# Patient Record
Sex: Male | Born: 1995 | Race: Black or African American | Hispanic: No | Marital: Single | Smoking: Current every day smoker
Health system: Southern US, Community
[De-identification: ages and names within clinical notes are randomized; demographics above are authoritative.]

## PROBLEM LIST (undated history)

## (undated) DIAGNOSIS — R51 Headache: Secondary | ICD-10-CM

## (undated) DIAGNOSIS — R569 Unspecified convulsions: Secondary | ICD-10-CM

## (undated) DIAGNOSIS — R519 Headache, unspecified: Secondary | ICD-10-CM

## (undated) HISTORY — PX: TONSILLECTOMY: SUR1361

---
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
## 2013-01-22 ENCOUNTER — Ambulatory Visit: Payer: Self-pay | Admitting: Pediatrics

## 2013-05-29 DIAGNOSIS — Z0289 Encounter for other administrative examinations: Secondary | ICD-10-CM

## 2013-10-07 ENCOUNTER — Ambulatory Visit: Payer: Self-pay

## 2015-07-25 ENCOUNTER — Emergency Department (HOSPITAL_COMMUNITY): Payer: Medicaid Other

## 2015-07-25 ENCOUNTER — Emergency Department (HOSPITAL_COMMUNITY)
Admission: EM | Admit: 2015-07-25 | Discharge: 2015-07-25 | Disposition: A | Discharge status: Home / Self Care | Payer: Medicaid Other | Attending: Emergency Medicine | Admitting: Emergency Medicine

## 2015-07-25 DIAGNOSIS — F419 Anxiety disorder, unspecified: Secondary | ICD-10-CM | POA: Insufficient documentation

## 2015-07-25 DIAGNOSIS — R0789 Other chest pain: Secondary | ICD-10-CM | POA: Diagnosis not present

## 2015-07-25 DIAGNOSIS — R51 Headache: Secondary | ICD-10-CM | POA: Diagnosis present

## 2015-07-25 LAB — BASIC METABOLIC PANEL
ANION GAP: 13 (ref 5–15)
BUN: 14 mg/dL (ref 6–20)
CALCIUM: 9.3 mg/dL (ref 8.9–10.3)
CO2: 20 mmol/L — AB (ref 22–32)
Chloride: 104 mmol/L (ref 101–111)
Creatinine, Ser: 1.24 mg/dL (ref 0.61–1.24)
GFR calc Af Amer: >60 mL/min (ref >60)
GFR calc non Af Amer: >60 mL/min (ref >60)
GLUCOSE: 78 mg/dL (ref 65–99)
Potassium: 3.3 mmol/L — ABNORMAL LOW (ref 3.5–5.1)
Sodium: 137 mmol/L (ref 135–145)

## 2015-07-25 LAB — CBC WITH DIFFERENTIAL/PLATELET
BASOS ABS: 0 10*3/uL (ref 0.0–0.1)
Basophils Relative: 0 %
Eosinophils Absolute: 0 10*3/uL (ref 0.0–0.7)
Eosinophils Relative: 0 %
HEMATOCRIT: 33.2 % — AB (ref 39.0–52.0)
Hemoglobin: 11.5 g/dL — ABNORMAL LOW (ref 13.0–17.0)
LYMPHS ABS: 1.1 10*3/uL (ref 0.7–4.0)
LYMPHS PCT: 13 %
MCH: 28.4 pg (ref 26.0–34.0)
MCHC: 34.6 g/dL (ref 30.0–36.0)
MCV: 82 fL (ref 78.0–100.0)
MONO ABS: 0.4 10*3/uL (ref 0.1–1.0)
MONOS PCT: 5 %
NEUTROS ABS: 6.8 10*3/uL (ref 1.7–7.7)
Neutrophils Relative %: 82 %
Platelets: 139 10*3/uL — ABNORMAL LOW (ref 150–400)
RBC: 4.05 MIL/uL — ABNORMAL LOW (ref 4.22–5.81)
RDW: 12.4 % (ref 11.5–15.5)
WBC: 8.3 10*3/uL (ref 4.0–10.5)

## 2015-07-25 NOTE — ED Provider Notes (Signed)
CSN: [id number]     Arrival date & time 07/25/15  [id number] History  By signing my name below, I, Tanda RockersMargaux Venter, attest that this documentation has been prepared under the direction and in the presence of Dione Boozeavid Osamu Olguin, MD. Electronically Signed: Tanda RockersMargaux Venter, ED Scribe. 07/25/2015. 3:42 AM.  No chief complaint on file.  The history is provided by the patient. No language interpreter was used.     HPI Comments: Johnathan Pearson is a 19 y.o. male brought in by ambulance, who presents to the Emergency Department complaining of gradual onset, constant, chest tightness that began earlier today around 2 PM (approximately 13.5 hours ago). Pt reports that he ran away from home earlier in the day due to family issues and has been walking on the highway [place] get [place]. He states that his chest began feeling tight while walking on the highway. He also complains of mild shortness of breath that is gradually improving on its own. Denies nausea, vomiting, diaphoresis, or any other associated symptoms. Pt is current every day smoker who smokes 2 ppd. Denies illicit drug use.    No past medical history on file. No past surgical history on file. No family history on file. Social History  Substance Use Topics  . Smoking status: Not on file  . Smokeless tobacco: Not on file  . Alcohol Use: Not on file    Review of Systems  Constitutional: Negative for diaphoresis.  Respiratory: Positive for chest tightness and shortness of breath.   Cardiovascular: Negative for leg swelling.  Gastrointestinal: Negative for nausea and vomiting.  All other systems reviewed and are negative.   Allergies  Review of patient's allergies indicates not on file.  Home Medications   Prior to Admission medications   Not on File   Triage VItals: BP 117/65 mmHg  Pulse 83  Temp(Src) 98.4 F (36.9 C) (Oral)  Resp 20  SpO2 100%   Physical Exam  Constitutional: He is oriented to person, place, and time. He  appears well-developed and well-nourished. No distress.  HENT:  Head: Normocephalic and atraumatic.  Eyes: Conjunctivae and EOM are normal. Pupils are equal, round, and reactive to light.  Neck: Normal range of motion. Neck supple. No JVD present.  Cardiovascular: Normal rate, regular rhythm and normal heart sounds.   No murmur heard. Pulmonary/Chest: Effort normal and breath sounds normal. He has no wheezes. He has no rales. He exhibits no tenderness.  Abdominal: Soft. Bowel sounds are normal. He exhibits no distension and no mass. There is no tenderness.  Musculoskeletal: Normal range of motion. He exhibits no edema.  Lymphadenopathy:    He has no cervical adenopathy.  Neurological: He is alert and oriented to person, place, and time. No cranial nerve deficit. He exhibits normal muscle tone. Coordination normal.  Skin: Skin is warm and dry. No rash noted.  Psychiatric: He has a normal mood and affect. His behavior is normal. Judgment and thought content normal.  Nursing note and vitals reviewed.   ED Course  Procedures (including critical care time)  DIAGNOSTIC STUDIES: Oxygen Saturation is 100% on RA, normal by my interpretation.    COORDINATION OF CARE: 3:42 AM-Discussed treatment plan which includes CXR, BMP, and CBC with pt at bedside and pt agreed to plan.   Labs Review Results for orders placed or performed during the hospital encounter of 07/25/15  CBC with Differential  Result Value Ref Range   WBC 8.3 4.0 - 10.5 K/uL   RBC 4.05 (L)  4.22 - 5.81 MIL/uL   Hemoglobin 11.5 (L) 13.0 - 17.0 g/dL   HCT 16.1 (L) 09.6 - 04.5 %   MCV 82.0 78.0 - 100.0 fL   MCH 28.4 26.0 - 34.0 pg   MCHC 34.6 30.0 - 36.0 g/dL   RDW 40.9 81.1 - 91.4 %   Platelets 139 (L) 150 - 400 K/uL   Neutrophils Relative % 82 %   Neutro Abs 6.8 1.7 - 7.7 K/uL   Lymphocytes Relative 13 %   Lymphs Abs 1.1 0.7 - 4.0 K/uL   Monocytes Relative 5 %   Monocytes Absolute 0.4 0.1 - 1.0 K/uL   Eosinophils  Relative 0 %   Eosinophils Absolute 0.0 0.0 - 0.7 K/uL   Basophils Relative 0 %   Basophils Absolute 0.0 0.0 - 0.1 K/uL  Basic metabolic panel  Result Value Ref Range   Sodium 137 135 - 145 mmol/L   Potassium 3.3 (L) 3.5 - 5.1 mmol/L   Chloride 104 101 - 111 mmol/L   CO2 20 (L) 22 - 32 mmol/L   Glucose, Bld 78 65 - 99 mg/dL   BUN 14 6 - 20 mg/dL   Creatinine, Ser 7.82 0.61 - 1.24 mg/dL   Calcium 9.3 8.9 - 95.6 mg/dL   GFR calc non Af Amer >60 >60 mL/min   GFR calc Af Amer >60 >60 mL/min   Anion gap 13 5 - 15    Imaging Review Dg Chest 2 View  07/25/2015  CLINICAL DATA:  Acute onset of constant chest tightness and mild shortness of breath. Initial encounter. EXAM: CHEST  2 VIEW COMPARISON:  Chest radiograph performed 03/15/2009 FINDINGS: The lungs are well-aerated and clear. There is no evidence of focal opacification, pleural effusion or pneumothorax. The heart is normal in size; the mediastinal contour is within normal limits. No acute osseous abnormalities are seen. IMPRESSION: No acute cardiopulmonary process seen. Electronically Signed   By: Roanna Raider M.D.   On: 07/25/2015 04:05   I have personally reviewed and evaluated these images and lab results as part of my medical decision-making.   EKG Interpretation   Date/Time:  Monday July 25 2015 04:11:35 EST Ventricular Rate:  80 PR Interval:  154 QRS Duration: 78 QT Interval:  373 QTC Calculation: 430 R Axis:   81 Text Interpretation:  Sinus rhythm ST elev, probable normal early repol  pattern When compared with ECG of 06/23/2007, No significant change was  found Confirmed by [facility]  MD, Bayler Gehrig ([id number]) on 07/25/2015 4:14:05 AM      MDM   Final diagnoses:  Anxiety    Chest discomfort with normal physical exam. Patient is somewhat anxious about his home situation and I think that this is playing a major role. Chest x-ray, ECG, screening labs are all unremarkable. On reevaluation, he stated that he was feeling  much better. He is given reassurance and told to follow-up with PCP.   I personally performed the services described in this documentation, which was scribed in my presence. The recorded information has been reviewed and is accurate.        Dione Booze, MD 07/25/15 [phone]

## 2015-07-25 NOTE — ED Notes (Signed)
Patient awaiting sister for DC

## 2015-07-25 NOTE — ED Notes (Signed)
Patient is alert and oriented x4.  [facility] for [facility] headache. Patient states that he was attempting to walk to [place] to stay with his mother.  Patient adds that  There are family issues with him living with his grandparents.  Patient only complaint is a headache

## 2015-07-25 NOTE — Discharge Instructions (Signed)
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
## 2015-07-25 NOTE — ED Notes (Signed)
Bed: WA18 Expected date:  Expected time:  Means of arrival:  Comments: EMS 

## 2016-02-18 ENCOUNTER — Encounter (HOSPITAL_COMMUNITY): Payer: Self-pay | Admitting: Emergency Medicine

## 2016-02-18 ENCOUNTER — Emergency Department (HOSPITAL_COMMUNITY)
Admission: EM | Admit: 2016-02-18 | Discharge: 2016-02-18 | Disposition: A | Discharge status: Home / Self Care | Payer: Medicaid Other | Attending: Emergency Medicine | Admitting: Emergency Medicine

## 2016-02-18 ENCOUNTER — Emergency Department (HOSPITAL_COMMUNITY): Payer: Medicaid Other

## 2016-02-18 DIAGNOSIS — Y939 Activity, unspecified: Secondary | ICD-10-CM | POA: Diagnosis not present

## 2016-02-18 DIAGNOSIS — S0081XA Abrasion of other part of head, initial encounter: Secondary | ICD-10-CM | POA: Diagnosis not present

## 2016-02-18 DIAGNOSIS — M25572 Pain in left ankle and joints of left foot: Secondary | ICD-10-CM | POA: Diagnosis not present

## 2016-02-18 DIAGNOSIS — T07XXXA Unspecified multiple injuries, initial encounter: Secondary | ICD-10-CM

## 2016-02-18 DIAGNOSIS — S50312A Abrasion of left elbow, initial encounter: Secondary | ICD-10-CM | POA: Diagnosis not present

## 2016-02-18 DIAGNOSIS — Y999 Unspecified external cause status: Secondary | ICD-10-CM | POA: Insufficient documentation

## 2016-02-18 DIAGNOSIS — Y929 Unspecified place or not applicable: Secondary | ICD-10-CM | POA: Insufficient documentation

## 2016-02-18 DIAGNOSIS — M545 Low back pain: Secondary | ICD-10-CM | POA: Insufficient documentation

## 2016-02-18 DIAGNOSIS — F17200 Nicotine dependence, unspecified, uncomplicated: Secondary | ICD-10-CM | POA: Diagnosis not present

## 2016-02-18 DIAGNOSIS — S59902A Unspecified injury of left elbow, initial encounter: Secondary | ICD-10-CM | POA: Diagnosis present

## 2016-02-18 HISTORY — DX: Headache: R51

## 2016-02-18 HISTORY — DX: Headache, unspecified: R51.9

## 2016-02-18 HISTORY — DX: Unspecified convulsions: R56.9

## 2016-02-18 MED ORDER — TETANUS-DIPHTH-ACELL PERTUSSIS 5-2.5-18.5 LF-MCG/0.5 IM SUSP
0.5000 mL | Freq: Once | INTRAMUSCULAR | Status: AC
  Administered 2016-02-18: 0.5 mL via INTRAMUSCULAR
  Filled 2016-02-18: qty 0.5

## 2016-02-18 MED ORDER — IBUPROFEN 800 MG PO TABS: 800.0000 mg | ORAL_TABLET | Freq: Three times a day (TID) | ORAL | Status: DC

## 2016-02-18 NOTE — Discharge Instructions (Signed)
Abrasion °An abrasion is a cut or scrape on the outer surface of your skin. An abrasion does not extend through all of the layers of your skin. It is important to care for your abrasion properly to prevent infection. °CAUSES °Most abrasions are caused by falling on or gliding across the ground or another surface. When your skin rubs on something, the outer and inner layer of skin rubs off.  °SYMPTOMS °A cut or scrape is the main symptom of this condition. The scrape may be bleeding, or it may appear red or pink. If there was an associated fall, there may be an underlying bruise. °DIAGNOSIS °An abrasion is diagnosed with a physical exam. °TREATMENT °Treatment for this condition depends on how large and deep the abrasion is. Usually, your abrasion will be cleaned with water and mild soap. This removes any dirt or debris that may be stuck. An antibiotic ointment may be applied to the abrasion to help prevent infection. A bandage (dressing) may be placed on the abrasion to keep it clean. °You may also need a tetanus shot. °HOME CARE INSTRUCTIONS °Medicines °· Take or apply medicines only as directed by your health care provider. °· If you were prescribed an antibiotic ointment, finish all of it even if you start to feel better. °Wound Care °· Clean the wound with mild soap and water 2-3 times per day or as directed by your health care provider. Pat your wound dry with a clean towel. Do not rub it. °· There are many different ways to close and cover a wound. Follow instructions from your health care provider about: °¨ Wound care. °¨ Dressing changes and removal. °· Check your wound every day for signs of infection. Watch for: °¨ Redness, swelling, or pain. °¨ Fluid, blood, or pus. °General Instructions °· Keep the dressing dry as directed by your health care provider. Do not take baths, swim, use a hot tub, or do anything that would put your wound underwater until your health care provider approves. °· If there is  swelling, raise (elevate) the injured area above the level of your heart while you are sitting or lying down. °· Keep all follow-up visits as directed by your health care provider. This is important. °SEEK MEDICAL CARE IF: °· You received a tetanus shot and you have swelling, severe pain, redness, or bleeding at the injection site. °· Your pain is not controlled with medicine. °· You have increased redness, swelling, or pain at the site of your wound. °SEEK IMMEDIATE MEDICAL CARE IF: °· You have a red streak going away from your wound. °· You have a fever. °· You have fluid, blood, or pus coming from your wound. °· You notice a bad smell coming from your wound or your dressing. °  °This information is not intended to replace advice given to you by your health care provider. Make sure you discuss any questions you have with your health care provider. °  °Document Released: 06/06/2005 Document Revised: 05/18/2015 Document Reviewed: 08/25/2014 °Elsevier Interactive Patient Education ©2016 [employer] °Contusion °A contusion is a deep bruise. Contusions are the result of a blunt injury to tissues and muscle fibers under the skin. The injury causes bleeding under the skin. The skin overlying the contusion may turn blue, purple, or yellow. Minor injuries will give you a painless contusion, but more severe contusions may stay painful and swollen for a few weeks.  °CAUSES  °This condition is usually caused by a blow, trauma, or direct force to   an area of the body. °SYMPTOMS  °Symptoms of this condition include: °· Swelling of the injured area. °· Pain and tenderness in the injured area. °· Discoloration. The area may have redness and then turn blue, purple, or yellow. °DIAGNOSIS  °This condition is diagnosed based on a physical exam and medical history. An X-ray, CT scan, or MRI may be needed to determine if there are any associated injuries, such as broken bones (fractures). °TREATMENT  °Specific treatment for this  condition depends on what area of the body was injured. In general, the best treatment for a contusion is resting, icing, applying pressure to (compression), and elevating the injured area. This is often called the RICE strategy. Over-the-counter anti-inflammatory medicines may also be recommended for pain control.  °HOME CARE INSTRUCTIONS  °· Rest the injured area. °· If directed, apply ice to the injured area: °¨ Put ice in a plastic bag. °¨ Place a towel between your skin and the bag. °¨ Leave the ice on for 20 minutes, 2-3 times per day. °· If directed, apply light compression to the injured area using an elastic bandage. Make sure the bandage is not wrapped too tightly. Remove and reapply the bandage as directed by your health care provider. °· If possible, raise (elevate) the injured area above the level of your heart while you are sitting or lying down. °· Take over-the-counter and prescription medicines only as told by your health care provider. °SEEK MEDICAL CARE IF: °· Your symptoms do not improve after several days of treatment. °· Your symptoms get worse. °· You have difficulty moving the injured area. °SEEK IMMEDIATE MEDICAL CARE IF:  °· You have severe pain. °· You have numbness in a hand or foot. °· Your hand or foot turns pale or cold. °  °This information is not intended to replace advice given to you by your health care provider. Make sure you discuss any questions you have with your health care provider. °  °Document Released: 06/06/2005 Document Revised: 05/18/2015 Document Reviewed: 01/12/2015 °Elsevier Interactive Patient Education ©2016 Elsevier Inc. ° °
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
## 2016-03-10 ENCOUNTER — Encounter (HOSPITAL_COMMUNITY): Payer: Self-pay | Admitting: *Deleted

## 2016-03-10 ENCOUNTER — Emergency Department (HOSPITAL_COMMUNITY)
Admission: EM | Admit: 2016-03-10 | Discharge: 2016-03-11 | Disposition: A | Discharge status: Home / Self Care | Payer: Medicaid Other | Attending: Emergency Medicine | Admitting: Emergency Medicine

## 2016-03-10 DIAGNOSIS — G40909 Epilepsy, unspecified, not intractable, without status epilepticus: Secondary | ICD-10-CM | POA: Diagnosis present

## 2016-03-10 DIAGNOSIS — Z79899 Other long term (current) drug therapy: Secondary | ICD-10-CM | POA: Diagnosis not present

## 2016-03-10 DIAGNOSIS — F17200 Nicotine dependence, unspecified, uncomplicated: Secondary | ICD-10-CM | POA: Diagnosis not present

## 2016-03-10 DIAGNOSIS — R569 Unspecified convulsions: Secondary | ICD-10-CM

## 2016-03-10 MED ORDER — VALPROATE SODIUM 500 MG/5ML IV SOLN
500.0000 mg | Freq: Once | INTRAVENOUS | Status: AC
  Administered 2016-03-10: 500 mg via INTRAVENOUS
  Filled 2016-03-10: qty 5

## 2016-03-10 MED ORDER — VALPROATE SODIUM 500 MG/5ML IV SOLN
INTRAVENOUS | Status: AC
  Filled 2016-03-10: qty 5
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
## 2016-03-11 MED ORDER — DIVALPROEX SODIUM ER 250 MG PO TB24: 250.0000 mg | ORAL_TABLET | Freq: Every day | ORAL | Status: AC

## 2016-03-11 NOTE — Discharge Instructions (Signed)

## 2017-04-25 ENCOUNTER — Emergency Department (HOSPITAL_COMMUNITY)
Admission: EM | Admit: 2017-04-25 | Discharge: 2017-04-25 | Disposition: A | Discharge status: Home / Self Care | Payer: Medicaid Other | Attending: Emergency Medicine | Admitting: Emergency Medicine

## 2017-04-25 ENCOUNTER — Encounter (HOSPITAL_COMMUNITY): Payer: Self-pay | Admitting: Emergency Medicine

## 2017-04-25 ENCOUNTER — Emergency Department (HOSPITAL_COMMUNITY): Payer: Medicaid Other

## 2017-04-25 DIAGNOSIS — S93401A Sprain of unspecified ligament of right ankle, initial encounter: Secondary | ICD-10-CM

## 2017-04-25 DIAGNOSIS — M79652 Pain in left thigh: Secondary | ICD-10-CM | POA: Diagnosis not present

## 2017-04-25 DIAGNOSIS — Y939 Activity, unspecified: Secondary | ICD-10-CM | POA: Insufficient documentation

## 2017-04-25 DIAGNOSIS — S93402A Sprain of unspecified ligament of left ankle, initial encounter: Secondary | ICD-10-CM | POA: Insufficient documentation

## 2017-04-25 DIAGNOSIS — F17200 Nicotine dependence, unspecified, uncomplicated: Secondary | ICD-10-CM | POA: Diagnosis not present

## 2017-04-25 DIAGNOSIS — Y929 Unspecified place or not applicable: Secondary | ICD-10-CM | POA: Diagnosis not present

## 2017-04-25 DIAGNOSIS — Z880 Allergy status to penicillin: Secondary | ICD-10-CM | POA: Diagnosis not present

## 2017-04-25 DIAGNOSIS — S76919A Strain of unspecified muscles, fascia and tendons at thigh level, unspecified thigh, initial encounter: Secondary | ICD-10-CM

## 2017-04-25 DIAGNOSIS — Y998 Other external cause status: Secondary | ICD-10-CM | POA: Insufficient documentation

## 2017-04-25 DIAGNOSIS — S99912A Unspecified injury of left ankle, initial encounter: Secondary | ICD-10-CM | POA: Diagnosis present

## 2017-04-25 MED ORDER — CYCLOBENZAPRINE HCL 10 MG PO TABS
10.0000 mg | ORAL_TABLET | Freq: Once | ORAL | Status: AC
  Administered 2017-04-25: 10 mg via ORAL
  Filled 2017-04-25: qty 1

## 2017-04-25 MED ORDER — ONDANSETRON HCL 4 MG PO TABS
4.0000 mg | ORAL_TABLET | Freq: Once | ORAL | Status: AC
Start: 2017-04-25 — End: 2017-04-25
  Administered 2017-04-25: 4 mg via ORAL
  Filled 2017-04-25: qty 1

## 2017-04-25 MED ORDER — IBUPROFEN 800 MG PO TABS
800.0000 mg | ORAL_TABLET | Freq: Once | ORAL | Status: AC
  Administered 2017-04-25: 800 mg via ORAL
  Filled 2017-04-25: qty 1

## 2017-04-25 MED ORDER — IBUPROFEN 600 MG PO TABS: 600.0000 mg | ORAL_TABLET | Freq: Four times a day (QID) | ORAL | 0 refills | Status: AC

## 2017-04-25 MED ORDER — CYCLOBENZAPRINE HCL 10 MG PO TABS: 10.0000 mg | ORAL_TABLET | Freq: Three times a day (TID) | ORAL | 0 refills | Status: AC

## 2017-04-25 NOTE — Discharge Instructions (Signed)
Please use the knee immobilizer and the ankle stirrup splint over the next 5-7 days. Please apply ice to your thigh and to your ankle, and keep your left lower extremity elevated above your waist. Please use ibuprofen with breakfast, lunch, dinner, and at bedtime. Please use Flexeril for the muscle spasms in your thigh 3 times daily as needed.This medication may cause drowsiness. Please do not drink, drive, or participate in activity that requires concentration while taking this medication.
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
## 2019-02-15 ENCOUNTER — Emergency Department (HOSPITAL_COMMUNITY)
Admission: EM | Admit: 2019-02-15 | Discharge: 2019-02-15 | Disposition: A | Discharge status: Home / Self Care | Payer: Self-pay | Attending: Emergency Medicine | Admitting: Emergency Medicine

## 2019-02-15 ENCOUNTER — Emergency Department (HOSPITAL_COMMUNITY): Payer: Self-pay

## 2019-02-15 ENCOUNTER — Other Ambulatory Visit: Payer: Self-pay

## 2019-02-15 ENCOUNTER — Encounter (HOSPITAL_COMMUNITY): Payer: Self-pay | Admitting: Emergency Medicine

## 2019-02-15 DIAGNOSIS — F17210 Nicotine dependence, cigarettes, uncomplicated: Secondary | ICD-10-CM | POA: Insufficient documentation

## 2019-02-15 DIAGNOSIS — K602 Anal fissure, unspecified: Secondary | ICD-10-CM

## 2019-02-15 DIAGNOSIS — K600 Acute anal fissure: Secondary | ICD-10-CM | POA: Insufficient documentation

## 2019-02-15 MED ORDER — DOCUSATE SODIUM 100 MG PO CAPS: 100.0000 mg | ORAL_CAPSULE | Freq: Every day | ORAL | 2 refills | Status: AC | PRN

## 2019-02-15 MED ORDER — HYDROCORTISONE ACETATE 25 MG RE SUPP: 25.0000 mg | Freq: Two times a day (BID) | RECTAL | 1 refills | Status: AC

## 2019-02-15 NOTE — ED Notes (Signed)
In w Brandon Melnick, PA for rectal exam  Pt tolerated poorly
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
## 2019-02-15 NOTE — ED Notes (Signed)
To Rad 

## 2019-02-15 NOTE — ED Provider Notes (Signed)
[facility] EMERGENCY DEPARTMENT Provider Note   CSN: [id number] Arrival date & time: 02/15/19  1417    History   Chief Complaint Chief Complaint  Patient presents with  . Wound Check    HPI Johnathan Pearson is a 23 y.o. male.     Pt reports he had rectal pain and bleeding after anal intercourse.  Pt reports no risk of std.   The history is provided by the patient. No language interpreter was used.  Rectal Bleeding  Quality:  Unable to specify Amount:  Scant Context: anal penetration   Relieved by:  Nothing Worsened by:  Nothing Ineffective treatments:  None tried Associated symptoms: no abdominal pain   Risk factors: no anticoagulant use and no hx of colorectal cancer     Past Medical History:  Diagnosis Date  . Headache   . Seizures (HCC)     There are no active problems to display for this patient.   History reviewed. No pertinent surgical history.      Home Medications    Prior to Admission medications   Medication Sig Start Date End Date Taking? Authorizing Provider  cyclobenzaprine (FLEXERIL) 10 MG tablet Take 1 tablet (10 mg total) by mouth 3 (three) times daily. 04/25/17   Ivery QualeBryant, Hobson, PA-C  divalproex (DEPAKOTE ER) 250 MG 24 hr tablet Take 1 tablet (250 mg total) by mouth daily. 03/11/16   Eber HongMiller, Brian, MD  docusate sodium (COLACE) 100 MG capsule Take 1 capsule (100 mg total) by mouth daily as needed. 02/15/19 02/15/20  Elson AreasSofia, Avier Jech K, PA-C  hydrocortisone (ANUSOL-HC) 25 MG suppository Place 1 suppository (25 mg total) rectally every 12 (twelve) hours. 02/15/19 02/15/20  Elson AreasSofia, Zakariah Urwin K, PA-C  ibuprofen (ADVIL,MOTRIN) 600 MG tablet Take 1 tablet (600 mg total) by mouth 4 (four) times daily. 04/25/17   Ivery QualeBryant, Hobson, PA-C    Family History No family history on file.  Social History Social History   Tobacco Use  . Smoking status: Current Every Day Smoker    Packs/day: 0.10  . Smokeless tobacco: Never Used  Substance Use Topics  . Alcohol use: Yes     Comment: occas  . Drug use: Never     Allergies   Penicillins   Review of Systems Review of Systems  Gastrointestinal: Positive for hematochezia. Negative for abdominal pain.  All other systems reviewed and are negative.    Physical Exam Updated Vital Signs BP 138/62 (BP Location: Left Arm)   Pulse 63   Temp 99.2 F (37.3 C) (Oral)   Resp 16   Ht 5\' 6"  (1.676 m)   Wt 77.1 kg   SpO2 100%   BMI 27.44 kg/m   Physical Exam Vitals signs and nursing note reviewed.  Constitutional:      Appearance: He is well-developed.  HENT:     Head: Normocephalic.     Mouth/Throat:     Mouth: Mucous membranes are moist.  Eyes:     Pupils: Pupils are equal, round, and reactive to light.  Neck:     Musculoskeletal: Normal range of motion.  Cardiovascular:     Rate and Rhythm: Normal rate.  Pulmonary:     Effort: Pulmonary effort is normal.  Abdominal:     General: There is no distension.  Genitourinary:    Comments: Tender rectal vault, no discharge, no lesions  Musculoskeletal: Normal range of motion.  Skin:    General: Skin is warm.  Neurological:     General: No focal deficit present.  Mental Status: He is alert and oriented to person, place, and time.  Psychiatric:        Mood and Affect: Mood normal.      ED Treatments / Results  Labs (all labs ordered are listed, but only abnormal results are displayed) Labs Reviewed - No data to display  EKG None  Radiology Dg Abdomen 1 View  Result Date: 02/15/2019 CLINICAL DATA:  Anal pain EXAM: ABDOMEN - 1 VIEW COMPARISON:  None. FINDINGS: Nonobstructive pattern of bowel gas with gas present to the rectum. Moderate burden of stool in the left and right colon. No obvious free air in the abdomen on supine radiographs. No radiopaque foreign body or other abnormality. IMPRESSION: Nonobstructive pattern of bowel gas with gas present to the rectum. Moderate burden of stool in the left and right colon. No obvious free air in  the abdomen on supine radiographs. No radiopaque foreign body or other abnormality. Electronically Signed   By: Eddie Candle M.D.   On: 02/15/2019 15:53    Procedures Procedures (including critical care time)  Medications Ordered in ED Medications - No data to display   Initial Impression / Assessment and Plan / ED Course  I have reviewed the triage vital signs and the nursing notes.  Pertinent labs & imaging results that were available during my care of the patient were reviewed by me and considered in my medical decision making (see chart for details).        Pt advised to avoid anal penetration until all symptoms resolve.  Pt given rx for anusol and colace   Final Clinical Impressions(s) / ED Diagnoses   Final diagnoses:  Rectal fissure    ED Discharge Orders         Ordered    hydrocortisone (ANUSOL-HC) 25 MG suppository  Every 12 hours     02/15/19 1636    docusate sodium (COLACE) 100 MG capsule  Daily PRN     02/15/19 1636        An After Visit Summary was printed and given to the patient.    Fransico Meadow, PA-C 02/15/19 1813    Nat Christen, MD 02/19/19 [phone]
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
## 2019-09-14 IMAGING — DX ABDOMEN - 1 VIEW
2 series · 2 of 2 positions shown · non-contrast
Comparison: None.

CLINICAL DATA: Anal pain

EXAM:
ABDOMEN - 1 VIEW

[abdomen kub (1 of 2)]
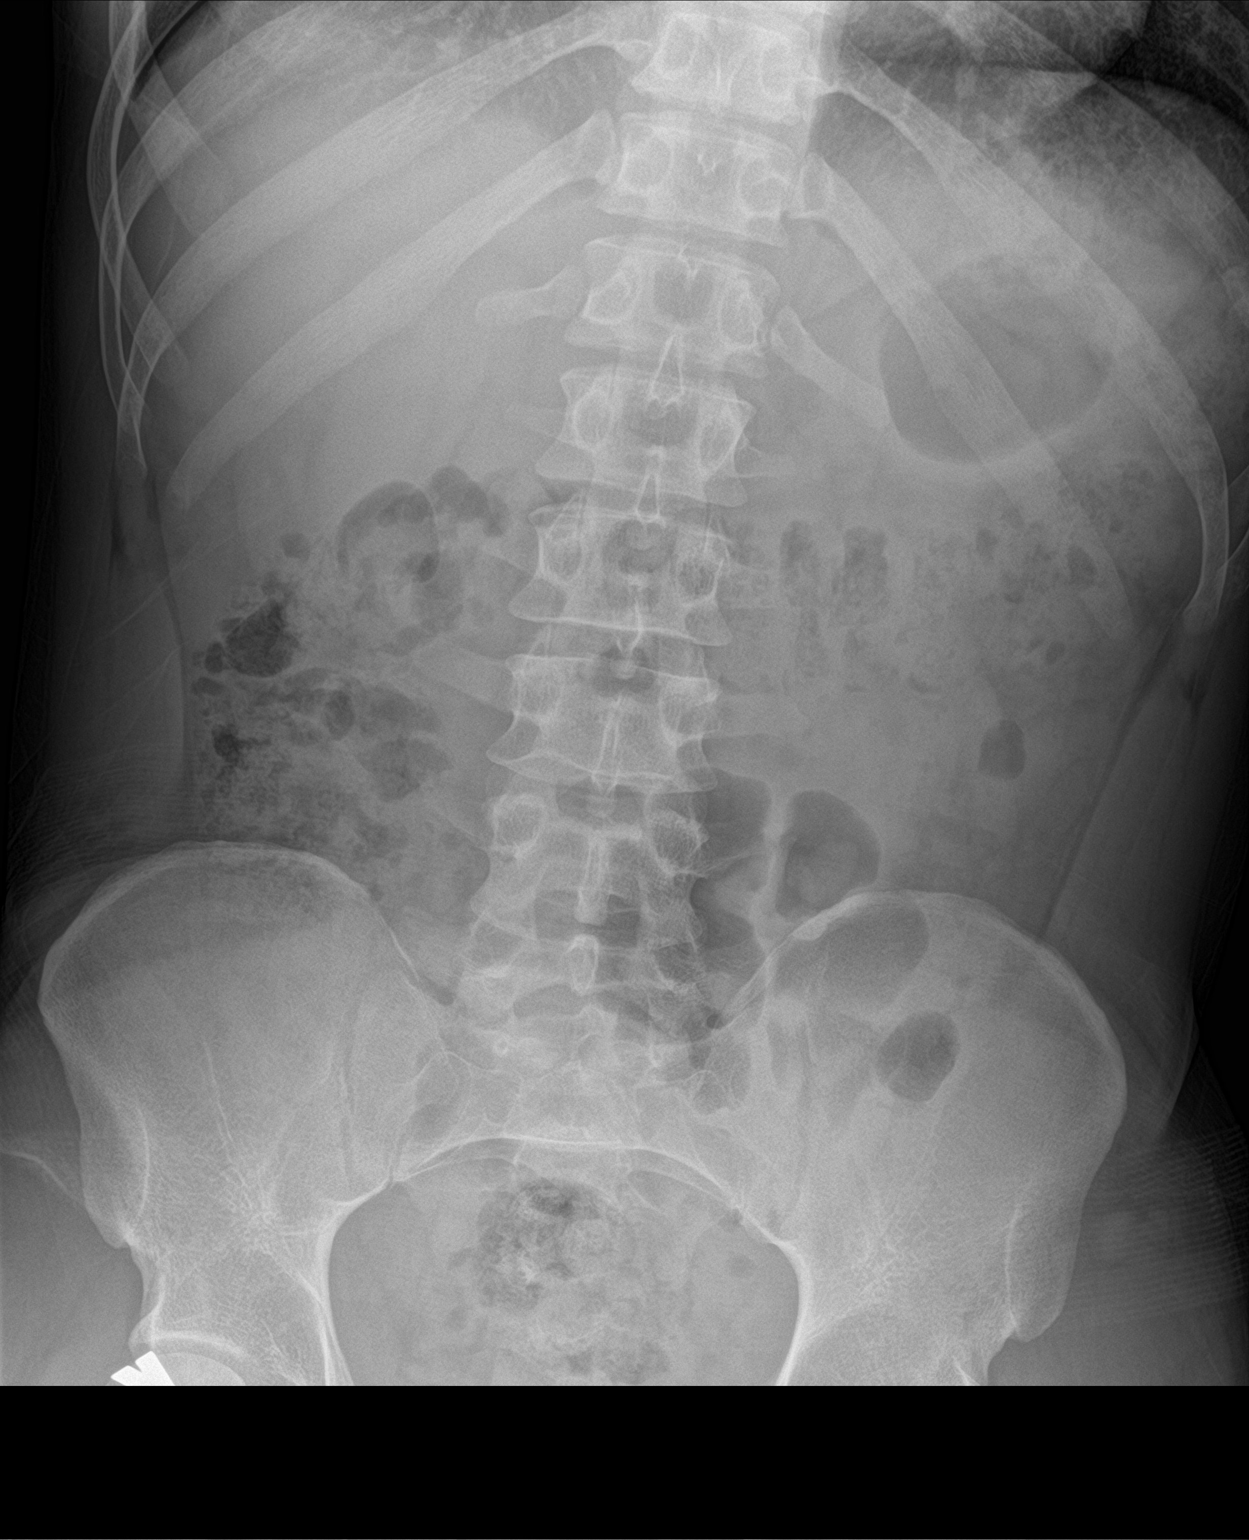

[abdomen kub (2 of 2)]
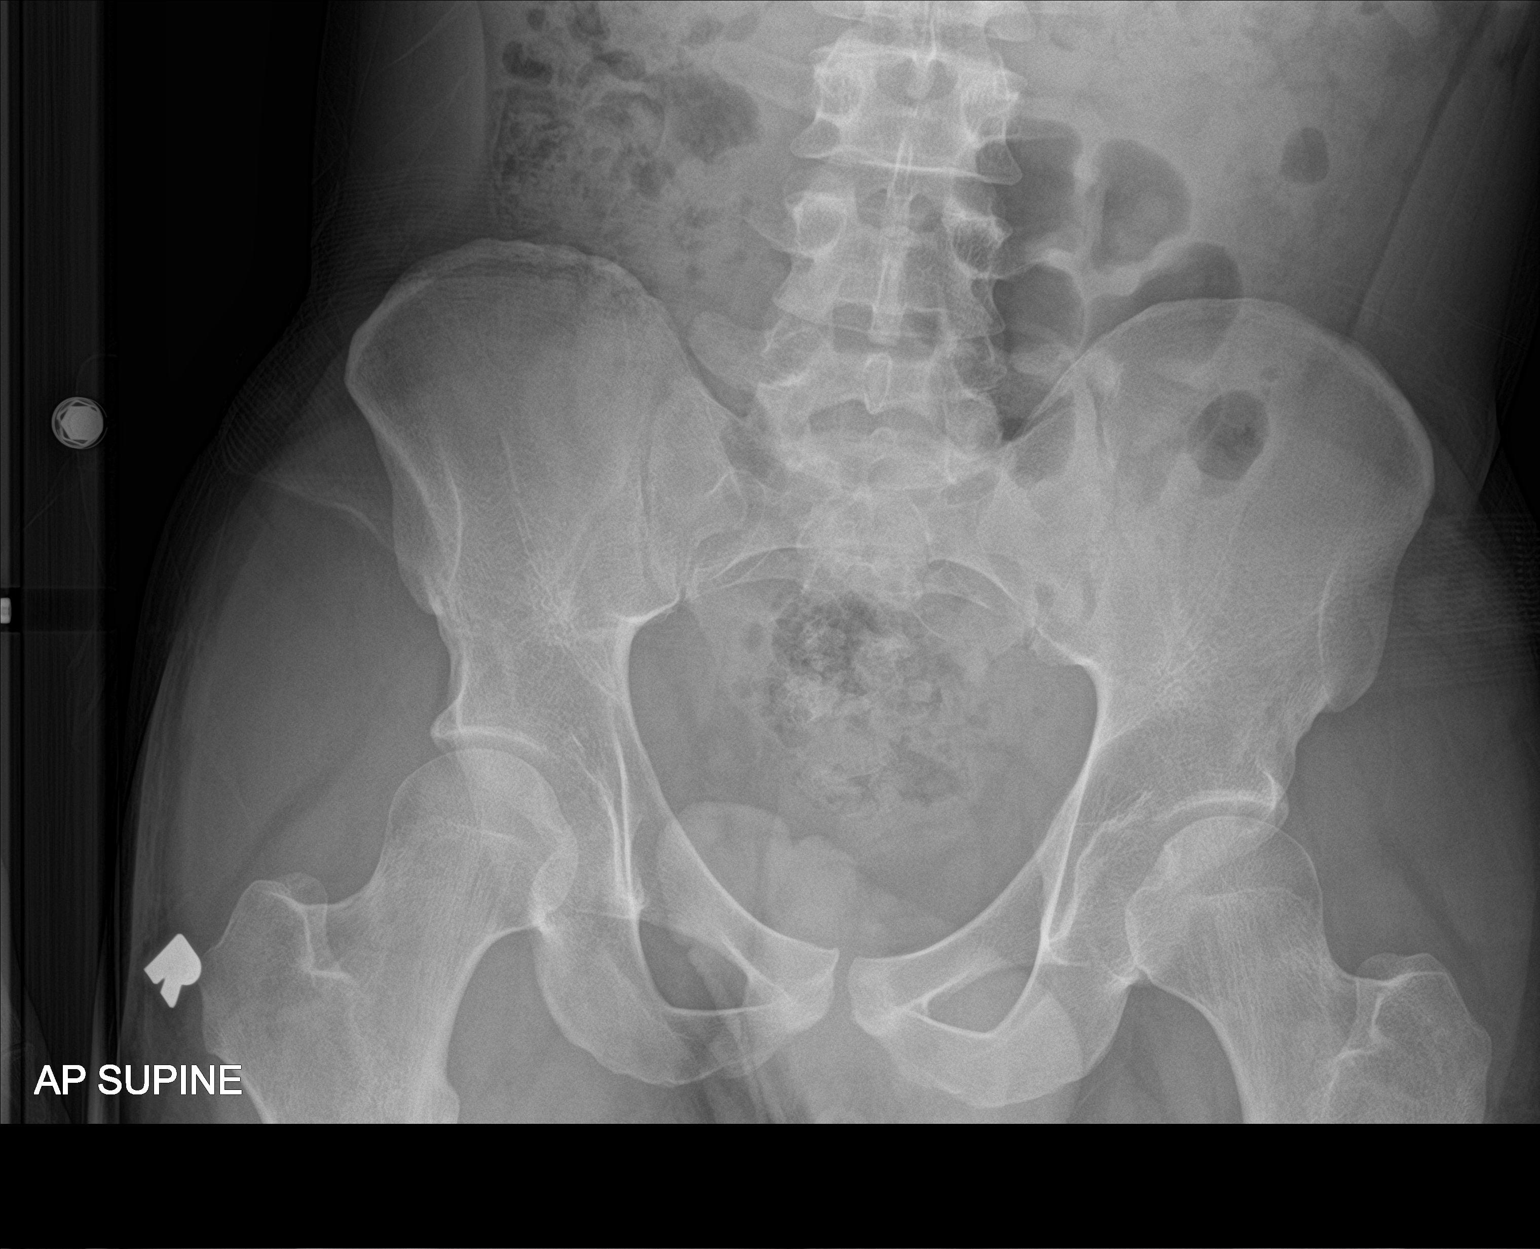

[2 of 2 positions shown; findings below may reference images not displayed]

FINDINGS: Nonobstructive pattern of bowel gas with gas present to the rectum.
Moderate burden of stool in the left and right colon. No obvious
free air in the abdomen on supine radiographs. No radiopaque foreign
body or other abnormality.
IMPRESSION: Nonobstructive pattern of bowel gas with gas present to the rectum.
Moderate burden of stool in the left and right colon. No obvious
free air in the abdomen on supine radiographs. No radiopaque foreign
body or other abnormality.
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
## 2024-06-30 ENCOUNTER — Other Ambulatory Visit: Payer: Self-pay

## 2024-06-30 ENCOUNTER — Emergency Department (HOSPITAL_COMMUNITY)

## 2024-06-30 ENCOUNTER — Emergency Department (HOSPITAL_COMMUNITY)
Admission: EM | Admit: 2024-06-30 | Discharge: 2024-07-01 | Disposition: A | Discharge status: Home / Self Care | Attending: Emergency Medicine | Admitting: Emergency Medicine

## 2024-06-30 ENCOUNTER — Encounter (HOSPITAL_COMMUNITY): Payer: Self-pay

## 2024-06-30 DIAGNOSIS — R1032 Left lower quadrant pain: Secondary | ICD-10-CM | POA: Diagnosis present

## 2024-06-30 DIAGNOSIS — R197 Diarrhea, unspecified: Secondary | ICD-10-CM | POA: Insufficient documentation

## 2024-06-30 LAB — URINALYSIS, ROUTINE W REFLEX MICROSCOPIC
Bilirubin Urine: NEGATIVE
Glucose, UA: NEGATIVE mg/dL
Hgb urine dipstick: NEGATIVE
Ketones, ur: NEGATIVE mg/dL
Leukocytes,Ua: NEGATIVE
Nitrite: NEGATIVE
Protein, ur: NEGATIVE mg/dL
Specific Gravity, Urine: 1.029 (ref 1.005–1.030)
pH: 5 (ref 5.0–8.0)

## 2024-06-30 LAB — COMPREHENSIVE METABOLIC PANEL WITH GFR
ALT: 14 U/L (ref 0–44)
AST: 27 U/L (ref 15–41)
Albumin: 4.6 g/dL (ref 3.5–5.0)
Alkaline Phosphatase: 43 U/L (ref 38–126)
Anion gap: 11 (ref 5–15)
BUN: 12 mg/dL (ref 6–20)
CO2: 25 mmol/L (ref 22–32)
Calcium: 9.2 mg/dL (ref 8.9–10.3)
Chloride: 101 mmol/L (ref 98–111)
Creatinine, Ser: 1.23 mg/dL (ref 0.61–1.24)
GFR, Estimated: >60 mL/min (ref >60)
Glucose, Bld: 104 mg/dL — ABNORMAL HIGH (ref 70–99)
Potassium: 3.7 mmol/L (ref 3.5–5.1)
Sodium: 137 mmol/L (ref 135–145)
Total Bilirubin: 0.3 mg/dL (ref 0.0–1.2)
Total Protein: 7.8 g/dL (ref 6.5–8.1)

## 2024-06-30 LAB — LIPASE, BLOOD: Lipase: 53 U/L — ABNORMAL HIGH (ref 11–51)

## 2024-06-30 LAB — CBC
HCT: 38.6 % — ABNORMAL LOW (ref 39.0–52.0)
Hemoglobin: 13 g/dL (ref 13.0–17.0)
MCH: 28.4 pg (ref 26.0–34.0)
MCHC: 33.7 g/dL (ref 30.0–36.0)
MCV: 84.3 fL (ref 80.0–100.0)
Platelets: 157 K/uL (ref 150–400)
RBC: 4.58 MIL/uL (ref 4.22–5.81)
RDW: 13.2 % (ref 11.5–15.5)
WBC: 5.1 K/uL (ref 4.0–10.5)
nRBC: 0 % (ref 0.0–0.2)

## 2024-06-30 MED ORDER — MORPHINE SULFATE (PF) 4 MG/ML IV SOLN
4.0000 mg | Freq: Once | INTRAVENOUS | Status: AC
  Administered 2024-06-30: 4 mg via INTRAVENOUS
  Filled 2024-06-30: qty 1

## 2024-06-30 MED ORDER — ONDANSETRON HCL 4 MG/2ML IJ SOLN
4.0000 mg | Freq: Once | INTRAMUSCULAR | Status: AC
  Administered 2024-06-30: 4 mg via INTRAVENOUS
  Filled 2024-06-30: qty 2

## 2024-06-30 MED ORDER — IOHEXOL 300 MG/ML  SOLN
100.0000 mL | Freq: Once | INTRAMUSCULAR | Status: AC | PRN
  Administered 2024-06-30: 100 mL via INTRAVENOUS

## 2024-06-30 NOTE — ED Provider Notes (Signed)
 [place] EMERGENCY DEPARTMENT AT [facility] Provider Note   CSN: [id number] Arrival date & time: 06/30/24  8151     Patient presents with: Abdominal Pain   ETHANIEL GARFIELD is a 28 y.o. male presenting with a 5-day history of left lower abdominal pain described as cramping, sometimes sharp pain which is intermittent in character.  Had had no changes in his stools until today when he passed only a small amount of watery stool after significant straining.  He denies history of constipation, also denies nausea or vomiting, fevers or chills.  There is no radiation of pain into his back or beyond his left lower quadrant.  His pain is worsened with movement, better when he lays on his side and applies pressure with his hand to the site.  Denies swelling, no groin or scrotal pain.  He has had no treatment prior to arrival.   The history is provided by the patient.       Prior to Admission medications   Medication Sig Start Date End Date Taking? Authorizing Provider  dicyclomine (BENTYL) 20 MG tablet Take 1 tablet (20 mg total) by mouth 2 (two) times daily as needed for spasms. 07/01/24  Yes Nikholas Geffre, PA-C  divalproex  (DEPAKOTE  ER) 250 MG 24 hr tablet Take 1 tablet (250 mg total) by mouth daily. 03/11/16  Yes Cleotilde Rogue, MD  ondansetron  (ZOFRAN ) 4 MG tablet Take 1 tablet (4 mg total) by mouth every 8 (eight) hours as needed for nausea or vomiting. 07/01/24  Yes Samanthamarie Ezzell, PA-C  cyclobenzaprine  (FLEXERIL ) 10 MG tablet Take 1 tablet (10 mg total) by mouth 3 (three) times daily. 04/25/17   Armida Culver, PA-C  ibuprofen  (ADVIL ,MOTRIN ) 600 MG tablet Take 1 tablet (600 mg total) by mouth 4 (four) times daily. 04/25/17   Armida Culver, PA-C    Allergies: Penicillins    Review of Systems  Constitutional:  Negative for chills and fever.  HENT:  Negative for congestion and sore throat.   Eyes: Negative.   Respiratory:  Negative for chest tightness and shortness of breath.    Cardiovascular:  Negative for chest pain.  Gastrointestinal:  Positive for abdominal pain and diarrhea. Negative for nausea and vomiting.  Genitourinary: Negative.  Negative for dysuria and flank pain.  Musculoskeletal:  Negative for arthralgias, joint swelling and neck pain.  Skin: Negative.  Negative for rash and wound.  Neurological:  Negative for dizziness, weakness, light-headedness, numbness and headaches.  Psychiatric/Behavioral: Negative.      Updated Vital Signs BP (!) 145/79 (BP Location: Left Arm)   Pulse 66   Temp 98.2 F (36.8 C) (Oral)   Resp 17   Ht 5' 6 (1.676 m)   Wt 77.1 kg   SpO2 99%   BMI 27.43 kg/m   Physical Exam Vitals and nursing note reviewed.  Constitutional:      Appearance: He is well-developed.  HENT:     Head: Normocephalic and atraumatic.  Eyes:     Conjunctiva/sclera: Conjunctivae normal.  Cardiovascular:     Rate and Rhythm: Normal rate and regular rhythm.     Heart sounds: Normal heart sounds.  Pulmonary:     Effort: Pulmonary effort is normal.     Breath sounds: Normal breath sounds. No wheezing.  Abdominal:     General: Abdomen is flat. Bowel sounds are normal.     Palpations: Abdomen is soft. There is no mass.     Tenderness: There is abdominal tenderness in the left lower  quadrant. There is no guarding or rebound.     Hernia: No hernia is present.  Musculoskeletal:        General: Normal range of motion.     Cervical back: Normal range of motion.  Skin:    General: Skin is warm and dry.  Neurological:     Mental Status: He is alert.     (all labs ordered are listed, but only abnormal results are displayed) Labs Reviewed  LIPASE, BLOOD - Abnormal; Notable for the following components:      Result Value   Lipase 53 (*)    All other components within normal limits  COMPREHENSIVE METABOLIC PANEL WITH GFR - Abnormal; Notable for the following components:   Glucose, Bld 104 (*)    All other components within normal limits   CBC - Abnormal; Notable for the following components:   HCT 38.6 (*)    All other components within normal limits  URINALYSIS, ROUTINE W REFLEX MICROSCOPIC    EKG: None  Radiology: CT ABDOMEN PELVIS W CONTRAST Result Date: 06/30/2024 EXAM: CT ABDOMEN AND PELVIS WITH CONTRAST 06/30/2024 11:41:08 PM TECHNIQUE: CT of the abdomen and pelvis was performed with the administration of 100 mL of iohexol (OMNIPAQUE) 300 MG/ML solution. Multiplanar reformatted images are provided for review. Automated exposure control, iterative reconstruction, and/or weight-based adjustment of the mA/kV was utilized to reduce the radiation dose to as low as reasonably achievable. COMPARISON: None available. CLINICAL HISTORY: LLQ abdominal pain. c/o left side abdominal pain since this past Friday. Pt reports he was only able to have 1 watery stool today. FINDINGS: LOWER CHEST: No acute abnormality. LIVER: The liver is unremarkable. GALLBLADDER AND BILE DUCTS: Gallbladder is unremarkable. No biliary ductal dilatation. SPLEEN: No acute abnormality. PANCREAS: No acute abnormality. ADRENAL GLANDS: No acute abnormality. KIDNEYS, URETERS AND BLADDER: No stones in the kidneys or ureters. No hydronephrosis. No perinephric or periureteral stranding. Urinary bladder is unremarkable. GI AND BOWEL: Stomach demonstrates no acute abnormality. There is no bowel obstruction. Normal appendix. PERITONEUM AND RETROPERITONEUM: No ascites. No free air. VASCULATURE: Aorta is normal in caliber. LYMPH NODES: No lymphadenopathy. REPRODUCTIVE ORGANS: No acute abnormality. BONES AND SOFT TISSUES: No acute osseous abnormality. No focal soft tissue abnormality. IMPRESSION: 1. No acute findings in the abdomen or pelvis. Electronically signed by: Norman Gatlin MD 06/30/2024 11:44 PM EDT RP Workstation: [id number]     Procedures   Medications Ordered in the ED  ondansetron  (ZOFRAN ) injection 4 mg (4 mg Intravenous Given 06/30/24 2321)  morphine (PF) 4  MG/ML injection 4 mg (4 mg Intravenous Given 06/30/24 2322)  iohexol (OMNIPAQUE) 300 MG/ML solution 100 mL (100 mLs Intravenous Contrast Given 06/30/24 2331)                                    Medical Decision Making Patient presenting with a 5-day history of intermittent left lower abdominal pain which does not radiate, is better with splinting the site, no other symptoms including nausea vomiting fevers.  He has been eating fairly regularly until today where his appetite has been reduced, but denies any worsening pain with meals.  Differential diagnosis including gastroenteritis, diverticulitis, obstruction, constipation with overflow diarrhea, IBS, IBD.  He has no GU complaints.  Labs and imaging as outlined below are reassuring.  He was prescribed Bentyl, advise close follow-up with his primary provider or recheck here for any worsening symptoms.  Amount and/or Complexity of  Data Reviewed Labs: ordered.    Details: Labs reviewed including a normal urinalysis, c-Met, CBC and lipase.  His lipase is slightly elevated at 53, he has no left upper abdominal pain. Radiology: ordered.    Details: CT abdomen and pelvis reviewed, negative for acute findings.  Risk Prescription drug management.        Final diagnoses:  Left lower quadrant abdominal pain  Diarrhea, unspecified type    ED Discharge Orders          Ordered    ondansetron  (ZOFRAN ) 4 MG tablet  Every 8 hours PRN        07/01/24 0002    dicyclomine (BENTYL) 20 MG tablet  2 times daily PRN        07/01/24 0002               Keian Odriscoll, PA-C 07/01/24 0024    Towana Ozell BROCKS, MD 07/01/24 [phone]

## 2024-06-30 NOTE — ED Notes (Signed)
 Patient transported to CT

## 2024-06-30 NOTE — ED Triage Notes (Signed)
 Pt arrived via POV c/o left side abdominal pain since this past Friday. Pt reports he was only able to have 1 watery stool today. Pt also reports pain feels better when he lays on his stomach and puts pressure on his left side.

## 2024-06-30 NOTE — ED Provider Notes (Incomplete)
  [place] EMERGENCY DEPARTMENT AT [facility] Provider Note   CSN: [id number] Arrival date & time: 06/30/24  8151     Patient presents with: Abdominal Pain   Johnathan Pearson is a 28 y.o. male   {Add pertinent medical, surgical, social history, OB history to YEP:67052} The history is provided by the patient.       Prior to Admission medications   Medication Sig Start Date End Date Taking? Authorizing Provider  divalproex  (DEPAKOTE  ER) 250 MG 24 hr tablet Take 1 tablet (250 mg total) by mouth daily. 03/11/16  Yes Cleotilde Rogue, MD  cyclobenzaprine  (FLEXERIL ) 10 MG tablet Take 1 tablet (10 mg total) by mouth 3 (three) times daily. 04/25/17   Armida Culver, PA-C  ibuprofen  (ADVIL ,MOTRIN ) 600 MG tablet Take 1 tablet (600 mg total) by mouth 4 (four) times daily. 04/25/17   Armida Culver, PA-C    Allergies: Penicillins    Review of Systems  Updated Vital Signs BP 122/86 (BP Location: Left Arm)   Pulse 73   Temp 98.4 F (36.9 C) (Oral)   Resp 17   Ht 5' 6 (1.676 m)   Wt 77.1 kg   SpO2 100%   BMI 27.43 kg/m   Physical Exam  (all labs ordered are listed, but only abnormal results are displayed) Labs Reviewed  LIPASE, BLOOD - Abnormal; Notable for the following components:      Result Value   Lipase 53 (*)    All other components within normal limits  COMPREHENSIVE METABOLIC PANEL WITH GFR - Abnormal; Notable for the following components:   Glucose, Bld 104 (*)    All other components within normal limits  CBC - Abnormal; Notable for the following components:   HCT 38.6 (*)    All other components within normal limits  URINALYSIS, ROUTINE W REFLEX MICROSCOPIC    EKG: None  Radiology: No results found.  {Document cardiac monitor, telemetry assessment procedure when appropriate:32947} Procedures   Medications Ordered in the ED  ondansetron  (ZOFRAN ) injection 4 mg (4 mg Intravenous Given 06/30/24 2321)  morphine (PF) 4 MG/ML injection 4 mg (4 mg  Intravenous Given 06/30/24 2322)  iohexol (OMNIPAQUE) 300 MG/ML solution 100 mL (100 mLs Intravenous Contrast Given 06/30/24 2331)      {Click here for ABCD2, HEART and other calculators REFRESH Note before signing:1}                              Medical Decision Making Amount and/or Complexity of Data Reviewed Labs: ordered. Radiology: ordered.  Risk Prescription drug management.   ***  {Document critical care time when appropriate  Document review of labs and clinical decision tools ie CHADS2VASC2, etc  Document your independent review of radiology images and any outside records  Document your discussion with family members, caretakers and with consultants  Document social determinants of health affecting pt's care  Document your decision making why or why not admission, treatments were needed:32947:::1}   Final diagnoses:  None    ED Discharge Orders     None

## 2024-07-01 MED ORDER — ONDANSETRON HCL 4 MG PO TABS: 4.0000 mg | ORAL_TABLET | Freq: Three times a day (TID) | ORAL | 0 refills | Status: AC | PRN

## 2024-07-01 MED ORDER — DICYCLOMINE HCL 20 MG PO TABS: 20.0000 mg | ORAL_TABLET | Freq: Two times a day (BID) | ORAL | 0 refills | Status: AC | PRN

## 2024-07-01 NOTE — Discharge Instructions (Signed)
 Your lab test and your CT scan today are reassuringly normal with no signs of infection or any other reason for your pain.  I suspect you may be having some intestinal colic which means a spasm of your intestines which can cause pain and can actually also trigger diarrhea.  I am prescribing you some medications to take in case your symptoms persist, the dicyclomine is an antispasmodic pain reliever and also can resolve diarrhea.  I recommend avoiding any foods that you know trigger cramping such as dairy.  Plan to follow-up with your primary provider for recheck if your symptoms are not improving with this treatment plan.
